# Patient Record
Sex: Male | Born: 1955 | Race: Black or African American | Hispanic: No | Smoking: Former smoker
Health system: Southern US, Community
[De-identification: ages and names within clinical notes are randomized; demographics above are authoritative.]

## PROBLEM LIST (undated history)

## (undated) DIAGNOSIS — E049 Nontoxic goiter, unspecified: Secondary | ICD-10-CM

## (undated) DIAGNOSIS — R1310 Dysphagia, unspecified: Secondary | ICD-10-CM

## (undated) DIAGNOSIS — E785 Hyperlipidemia, unspecified: Secondary | ICD-10-CM

## (undated) HISTORY — DX: Dysphagia, unspecified: R13.10

## (undated) HISTORY — DX: Hyperlipidemia, unspecified: E78.5

## (undated) HISTORY — PX: EYE SURGERY: SHX253

## (undated) HISTORY — DX: Nontoxic goiter, unspecified: E04.9

---

## 2016-01-16 ENCOUNTER — Other Ambulatory Visit: Payer: Self-pay | Admitting: Internal Medicine

## 2016-01-16 DIAGNOSIS — R1319 Other dysphagia: Secondary | ICD-10-CM

## 2016-01-17 ENCOUNTER — Ambulatory Visit
Admission: RE | Admit: 2016-01-17 | Discharge: 2016-01-17 | Disposition: A | Discharge status: Home / Self Care | Payer: 59 | Source: Ambulatory Visit | Attending: Internal Medicine | Admitting: Internal Medicine

## 2016-01-17 ENCOUNTER — Other Ambulatory Visit: Payer: Self-pay | Admitting: Internal Medicine

## 2016-01-17 DIAGNOSIS — M2570 Osteophyte, unspecified joint: Secondary | ICD-10-CM | POA: Diagnosis not present

## 2016-01-17 DIAGNOSIS — R1319 Other dysphagia: Secondary | ICD-10-CM | POA: Diagnosis present

## 2016-01-17 DIAGNOSIS — M542 Cervicalgia: Secondary | ICD-10-CM

## 2016-01-17 DIAGNOSIS — R1310 Dysphagia, unspecified: Secondary | ICD-10-CM

## 2016-01-22 ENCOUNTER — Other Ambulatory Visit: Payer: Self-pay | Admitting: Internal Medicine

## 2016-01-22 ENCOUNTER — Ambulatory Visit
Admission: RE | Admit: 2016-01-22 | Discharge: 2016-01-22 | Disposition: A | Discharge status: Home / Self Care | Payer: 59 | Source: Ambulatory Visit | Attending: Internal Medicine | Admitting: Internal Medicine

## 2016-01-22 DIAGNOSIS — M542 Cervicalgia: Secondary | ICD-10-CM

## 2016-01-22 MED ORDER — IOPAMIDOL (ISOVUE-300) INJECTION 61%
75.0000 mL | Freq: Once | INTRAVENOUS | Status: AC | PRN
  Administered 2016-01-22: 75 mL via INTRAVENOUS

## 2016-01-23 ENCOUNTER — Ambulatory Visit: Payer: Self-pay

## 2016-07-14 ENCOUNTER — Encounter: Payer: Self-pay | Admitting: General Surgery

## 2016-07-31 ENCOUNTER — Encounter: Payer: Self-pay | Admitting: General Surgery

## 2016-07-31 ENCOUNTER — Encounter: Payer: Self-pay | Admitting: *Deleted

## 2016-07-31 ENCOUNTER — Ambulatory Visit (INDEPENDENT_AMBULATORY_CARE_PROVIDER_SITE_OTHER): Payer: 59 | Admitting: General Surgery

## 2016-07-31 VITALS — BP 122/74 | HR 82 | Resp 14 | Ht 72.0 in | Wt 235.0 lb

## 2016-07-31 DIAGNOSIS — M542 Cervicalgia: Secondary | ICD-10-CM | POA: Diagnosis not present

## 2016-07-31 NOTE — Patient Instructions (Signed)
Recommend orthopedic or neurosurgeon  The patient is aware to call back for any questions or concerns.

## 2016-07-31 NOTE — Progress Notes (Signed)
Patient has been scheduled for an appointment with Altamese CabalMaurice Jones, PA at [facility] for 07-31-16 at 2:30 pm.

## 2016-07-31 NOTE — Progress Notes (Addendum)
Patient ID: James Dunlap, male   DOB: [birth year], 61 y.o.   MRN: [id number]  Chief Complaint  Patient presents with  . Lipoma    HPI James Dunlap is a 61 y.o. male. Here today for evaluation of a possible lipoma on his shoulder close to his neck area. He states it has been there for 3 weeks and started with a sore throat. He is having some discomfort with the swelling with sharp pain. The pain is at the base of his left head.He has been to the ED twice at [facility] and x rays were done there. He states the medications are not helping.  He describes focal pain behind left ear.   CT scan was done 01-22-16 for left neck swelling. I have reviewed the history of present illness with the patient.     HPI  Past Medical History:  Diagnosis Date  . Dysphagia   . Goiter   . Hyperlipidemia     Past Surgical History:  Procedure Laterality Date  . EYE SURGERY      History reviewed. No pertinent family history.  Social History Social History  Substance Use Topics  . Smoking status: Former Smoker    Quit date: 05/06/1987  . Smokeless tobacco: Never Used  . Alcohol use Yes     Comment: occasionally    No Known Allergies  Current Outpatient Prescriptions  Medication Sig Dispense Refill  . celecoxib (CELEBREX) 100 MG capsule Take 100 mg by mouth 2 (two) times daily.    . diazepam (VALIUM) 5 MG tablet Take 5 mg by mouth every 8 (eight) hours as needed for anxiety.    Marland Kitchen. levothyroxine (SYNTHROID, LEVOTHROID) 100 MCG tablet Take 100 mcg by mouth daily before breakfast.    . methocarbamol (ROBAXIN) 750 MG tablet Take 750 mg by mouth 4 (four) times daily.     No current facility-administered medications for this visit.     Review of Systems Review of Systems  Constitutional: Negative.   Respiratory: Negative.   Cardiovascular: Negative.     Blood pressure 122/74, pulse 82, resp. rate 14, height 6' (1.829 m), weight 235 lb (106.6 kg).  Physical Exam Physical Exam   Constitutional: He is oriented to person, place, and time. He appears well-developed and well-nourished.  HENT:  Mouth/Throat: Oropharynx is clear and moist.  Eyes: Conjunctivae are normal. No scleral icterus.  Neck: Neck supple.    Subtle fullness in left supraclavicular region, flattens out when sits upright. Feels like prominent subcutaneous tissue. Pt with notable stiffness of neck, unable to move it much at all due to pain  Lymphadenopathy:    He has no cervical adenopathy.  Neurological: He is alert and oriented to person, place, and time.  Skin: Skin is warm and dry.  Psychiatric: His behavior is normal.    Data Reviewed Prior notes, CT scan and chest x ray No findings other than known thyroid nodule Assessment    Severe neck pain- no apparent cause. Fullness in left supraclavicular region  Not suggestive of a defined lipoma-can be followed.      Plan    Recommend orthopedic or neurosurgeon referral for further evaluation. Pt agreeable.     This information has been scribed by Dorathy DaftMarsha Hatch RN, BSN,BC.   James Dunlap 08/06/2016, 1:01 PM

## 2016-10-01 ENCOUNTER — Other Ambulatory Visit: Payer: Self-pay | Admitting: Neurology

## 2016-10-01 DIAGNOSIS — M542 Cervicalgia: Secondary | ICD-10-CM

## 2016-10-09 ENCOUNTER — Ambulatory Visit: Payer: 59

## 2016-10-21 ENCOUNTER — Ambulatory Visit
Admission: RE | Admit: 2016-10-21 | Discharge: 2016-10-21 | Disposition: A | Discharge status: Home / Self Care | Payer: 59 | Source: Ambulatory Visit | Attending: Neurology | Admitting: Neurology

## 2016-10-21 DIAGNOSIS — M50221 Other cervical disc displacement at C4-C5 level: Secondary | ICD-10-CM | POA: Insufficient documentation

## 2016-10-21 DIAGNOSIS — M542 Cervicalgia: Secondary | ICD-10-CM | POA: Diagnosis present

## 2016-10-21 DIAGNOSIS — M50222 Other cervical disc displacement at C5-C6 level: Secondary | ICD-10-CM | POA: Insufficient documentation

## 2016-10-21 DIAGNOSIS — M4802 Spinal stenosis, cervical region: Secondary | ICD-10-CM | POA: Insufficient documentation

## 2017-09-19 IMAGING — RF DG ESOPHAGUS
5 series · 5 of 5 positions shown · non-contrast
Comparison: None in PACs

CLINICAL DATA: Two months of throat discomfort generally with
solids with palpable swelling in the neck on the left with some
discomfort in the left shoulder. No history of gastroesophageal
reflux. Patient reports no significant difficulty with liquids.

EXAM:
ESOPHOGRAM / BARIUM SWALLOW / BARIUM TABLET STUDY
TECHNIQUE: Combined double contrast and single contrast examination performed
using effervescent crystals, thick barium liquid, and thin barium
liquid. The patient was observed with fluoroscopy swallowing a 13 mm
barium sulphate tablet.
FLUOROSCOPY TIME:  Fluoroscopy Time:  1 minutes, 6 seconds
Radiation Exposure Index (if provided by the fluoroscopic device):
1246.86 uNymF
Number of Acquired Spot Images: 5 +1 video loop

[Series 1: fluoro_barium 2fps_bw · 0.17mm/px · 1 of 1 slices shown (1 of 5)]
[im 1/1]
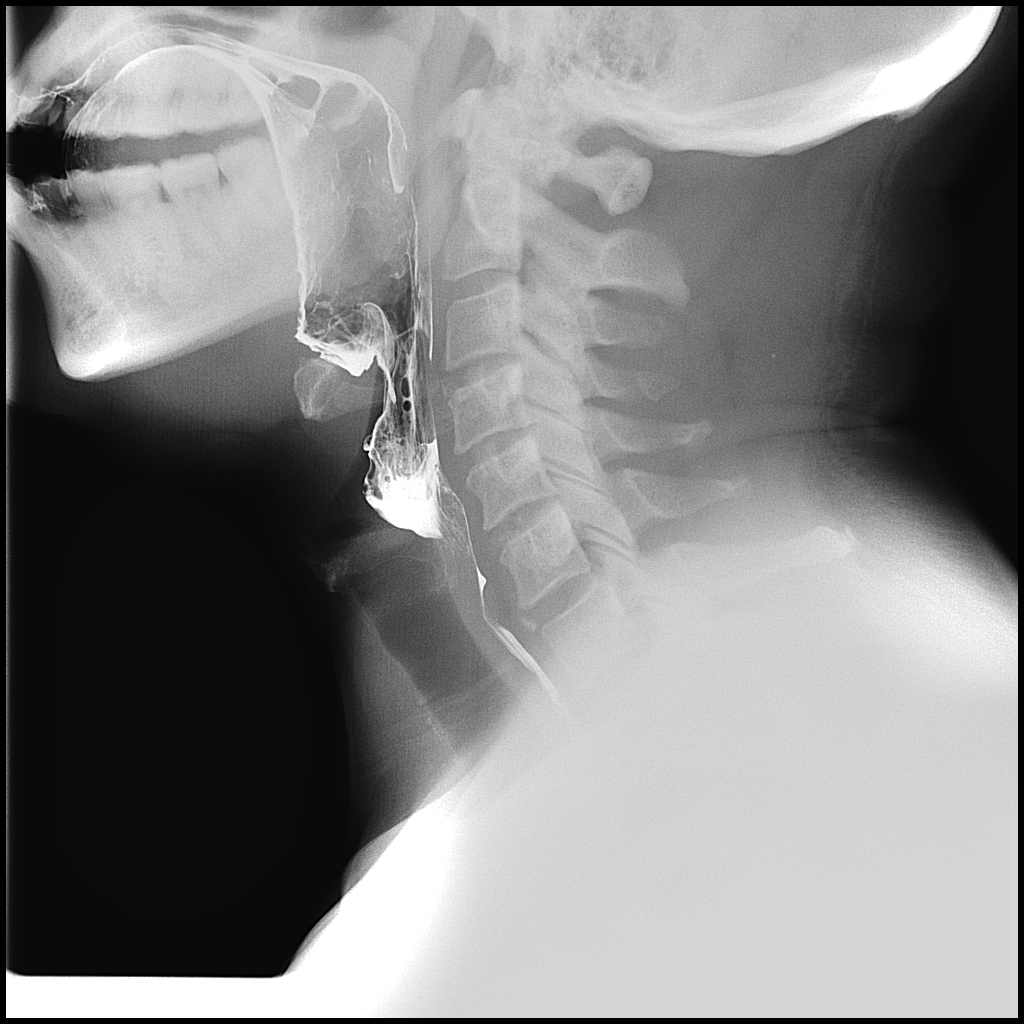

[Series 2: fluoro_barium 2fps_bw · 0.18mm/px · 1 of 1 slices shown (2 of 5)]
[im 1/1]
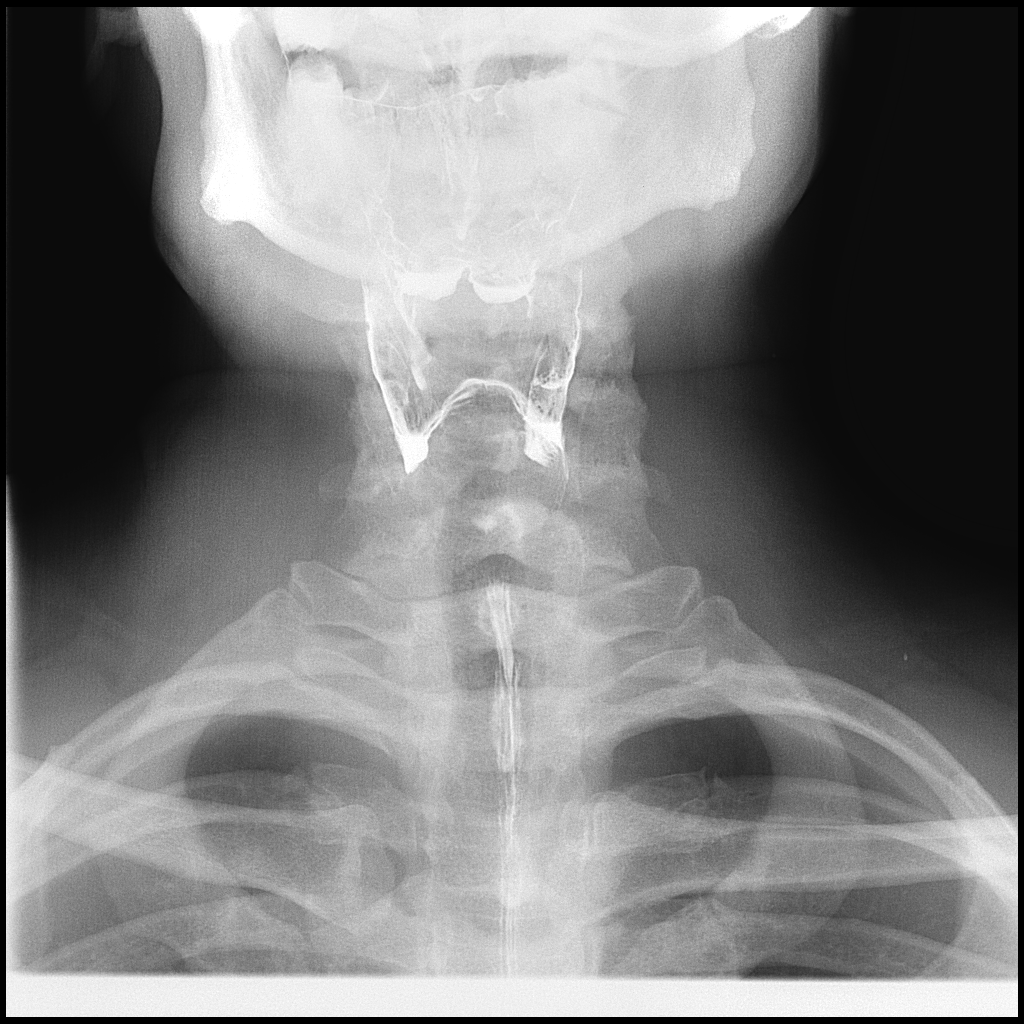

[Series 3: fluoro_barium 2fps_bw · 0.18mm/px · 1 of 1 slices shown (3 of 5)]
[im 1/1]
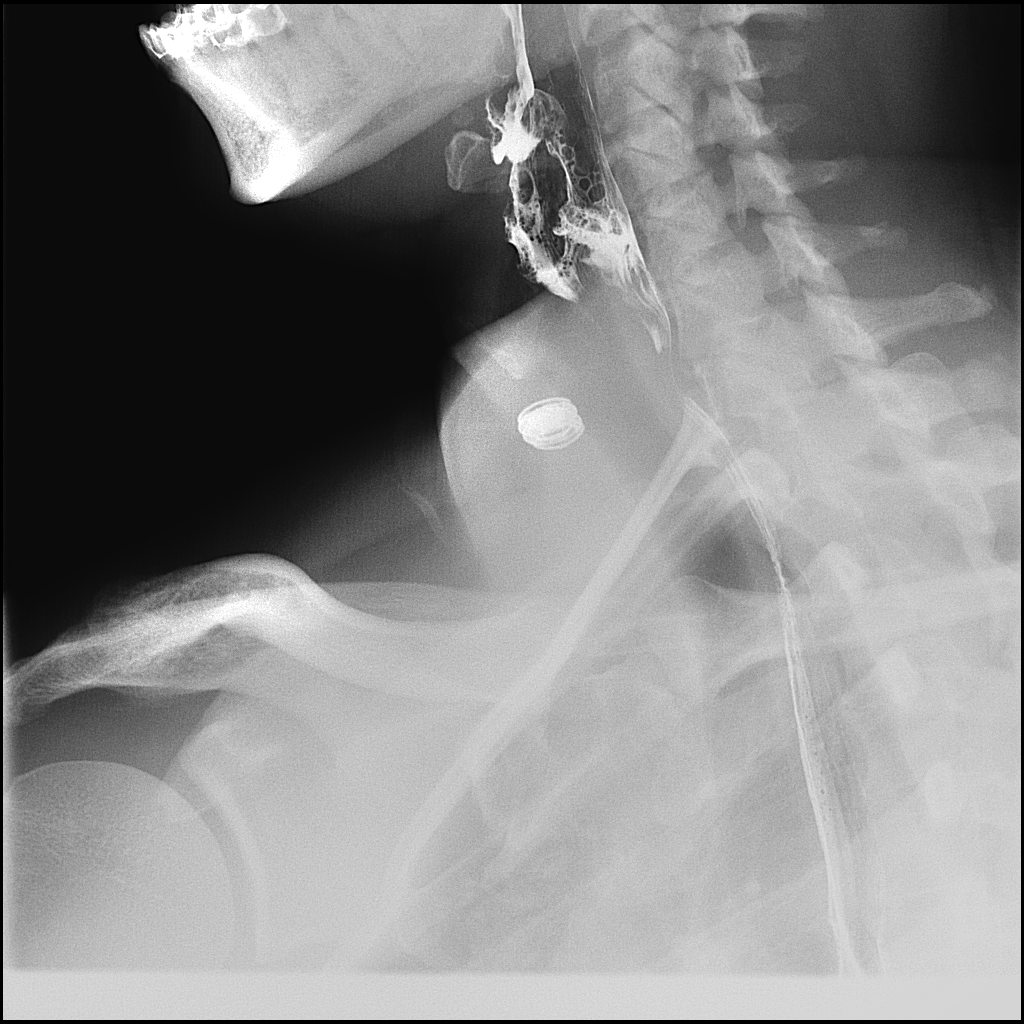

[Series 4: fluoro_barium 2fps_bw · 0.17mm/px · 1 of 1 slices shown (4 of 5)]
[im 1/1]
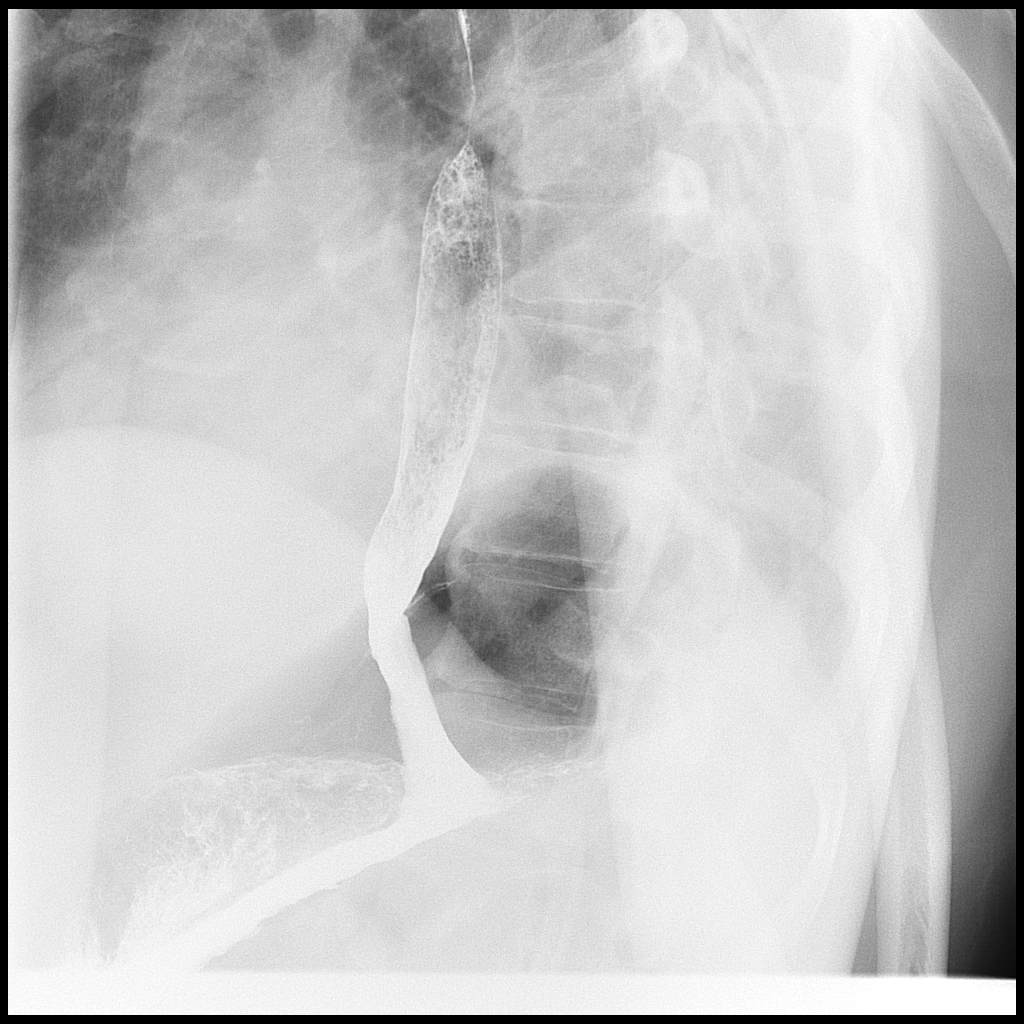

[Series 5: fluoro_barium 2fps_bw · 0.18mm/px · 1 of 1 slices shown (5 of 5)]
[im 1/1]
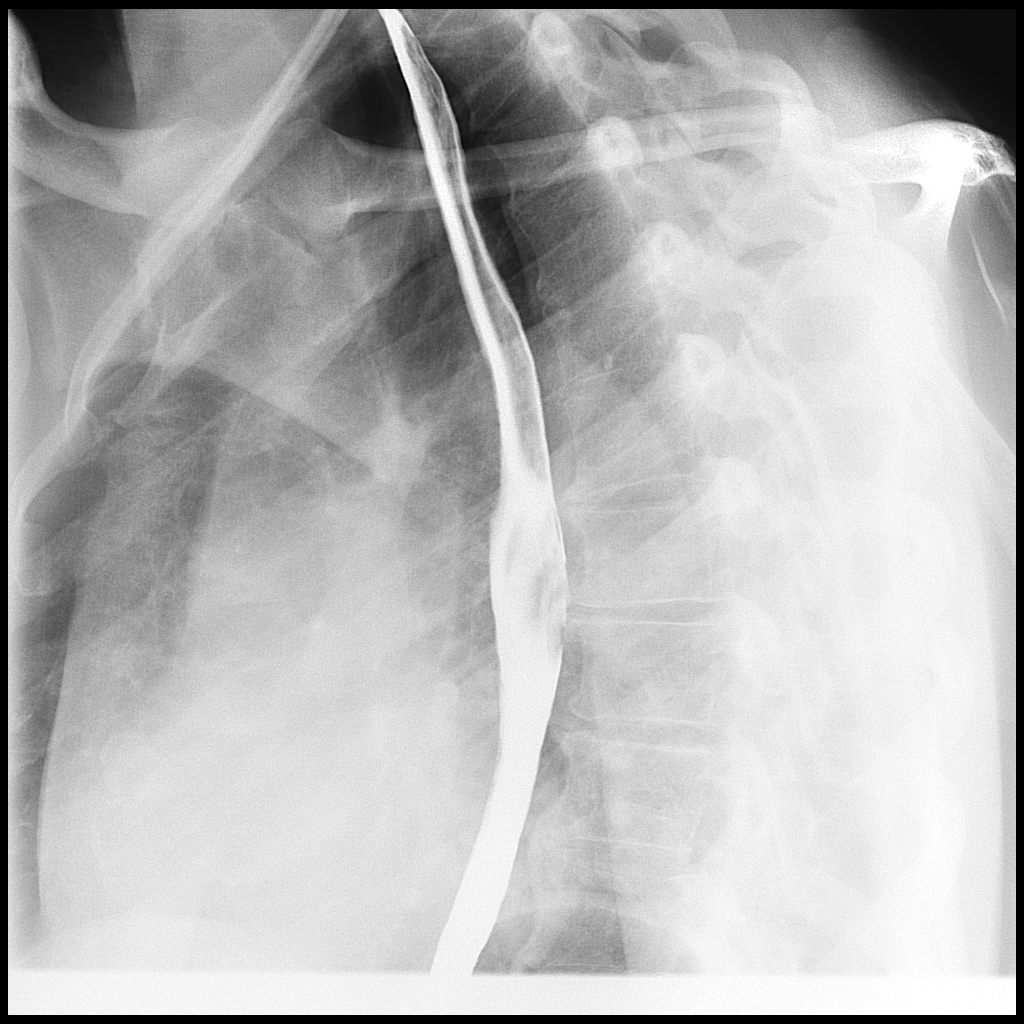

[5 of 5 positions shown; findings below may reference images not displayed]

FINDINGS: The cervical esophagus distended well. There was no laryngeal
penetration of the barium. A mild impression is made upon the
posterior aspect of the cervical spit esophagus by an osteophyte at
C6-7. The thoracic esophagus distended well. The mucosal pattern was
normal. There was no evidence of stricture nor esophagitis. There
was no hiatal hernia. No gastroesophageal reflux was observed. The
barium tablet passed promptly from the mouth to the stomach.
IMPRESSION: Normal barium swallow examination. No significant mass-effect upon
normal esophageal structures is noted. Minimal impression due to its
C6-7 osteophyte is noted on the posterior aspect of the cervical
esophagus.

Given the patient's symptoms, direct examination of the hypopharynx
as well with CT scanning of the neck may be useful. The patient
indicated point tenderness on the left just lateral to the lower
sternocleidomastoid muscle.

## 2017-09-19 IMAGING — RF DG ESOPHAGUS
1 series · 4 of 4 positions shown · non-contrast
Comparison: None in PACs

CLINICAL DATA: Two months of throat discomfort generally with
solids with palpable swelling in the neck on the left with some
discomfort in the left shoulder. No history of gastroesophageal
reflux. Patient reports no significant difficulty with liquids.

EXAM:
ESOPHOGRAM / BARIUM SWALLOW / BARIUM TABLET STUDY
TECHNIQUE: Combined double contrast and single contrast examination performed
using effervescent crystals, thick barium liquid, and thin barium
liquid. The patient was observed with fluoroscopy swallowing a 13 mm
barium sulphate tablet.
FLUOROSCOPY TIME:  Fluoroscopy Time:  1 minutes, 6 seconds
Radiation Exposure Index (if provided by the fluoroscopic device):
1246.86 uNymF
Number of Acquired Spot Images: 5 +1 video loop

[Series 6: fluoro_barium 2fps_bw · 0.18mm/px · 4 of 16 frames shown]
[frame 3/16]
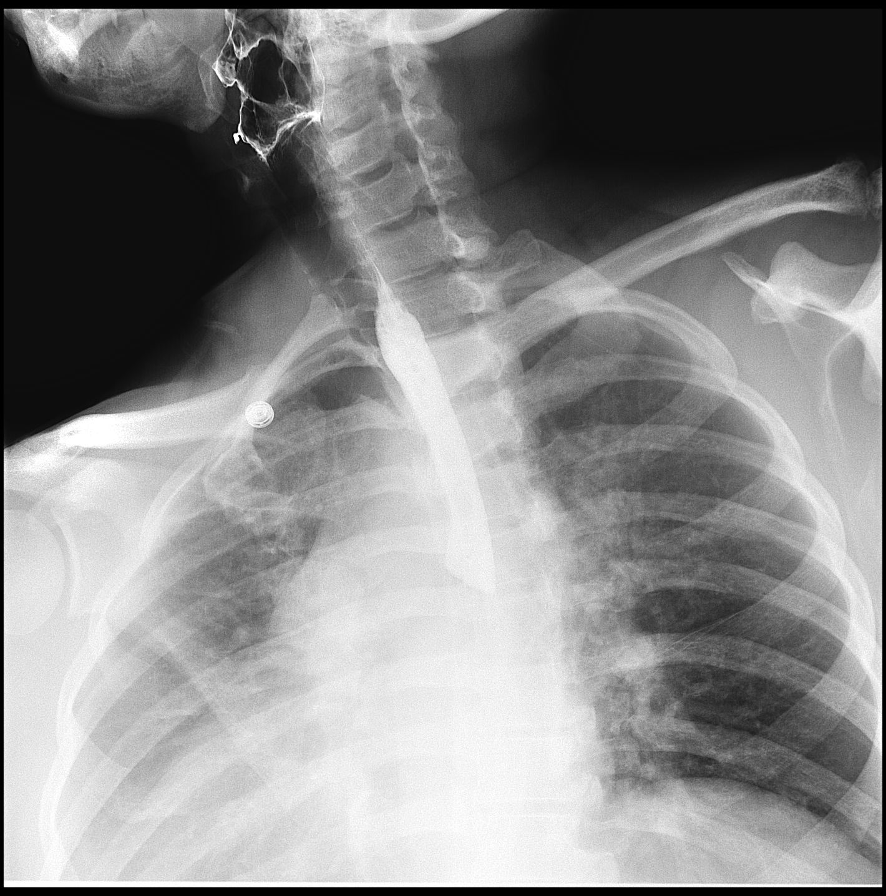
[frame 8/16]
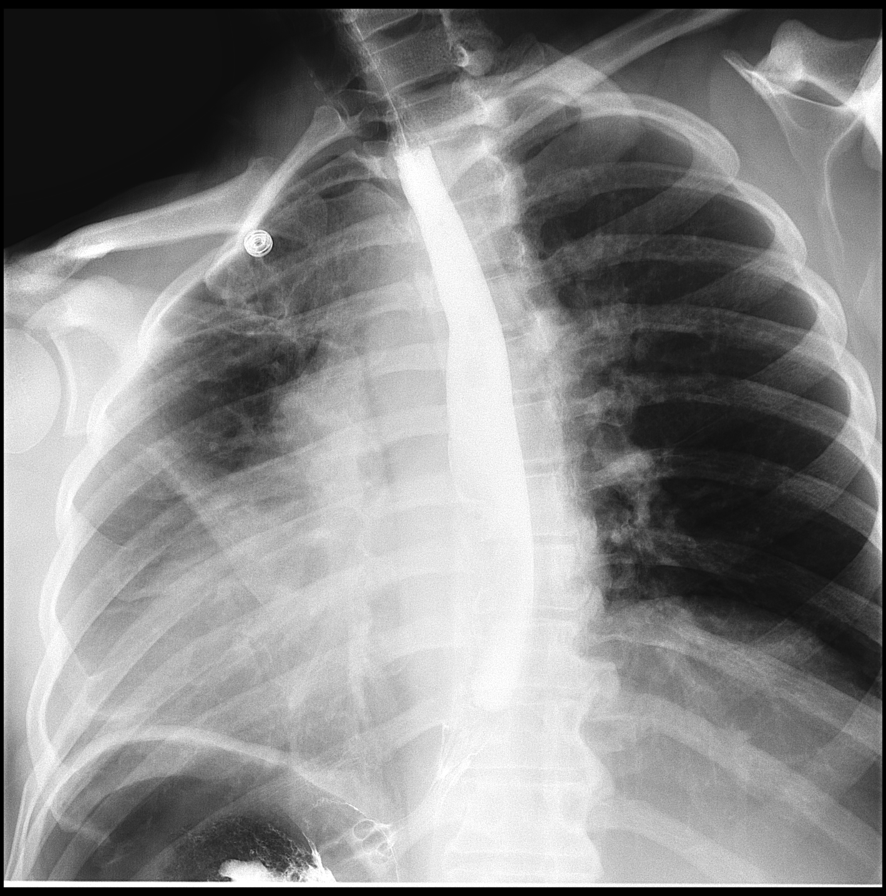
[frame 9/16]
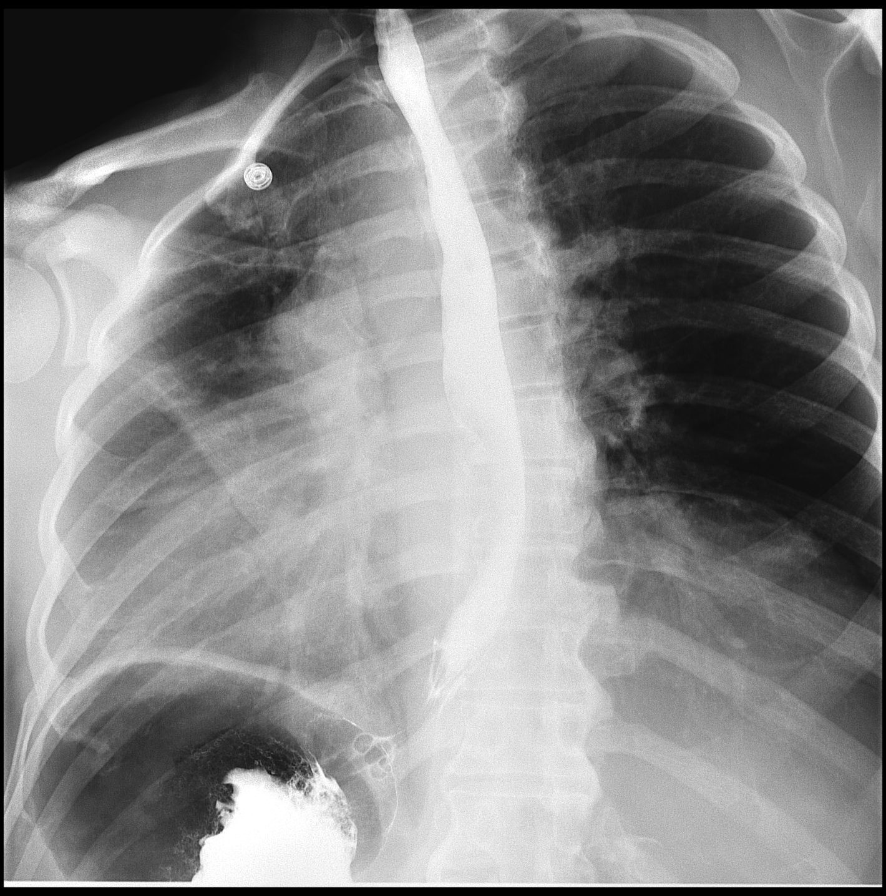
[frame 14/16]
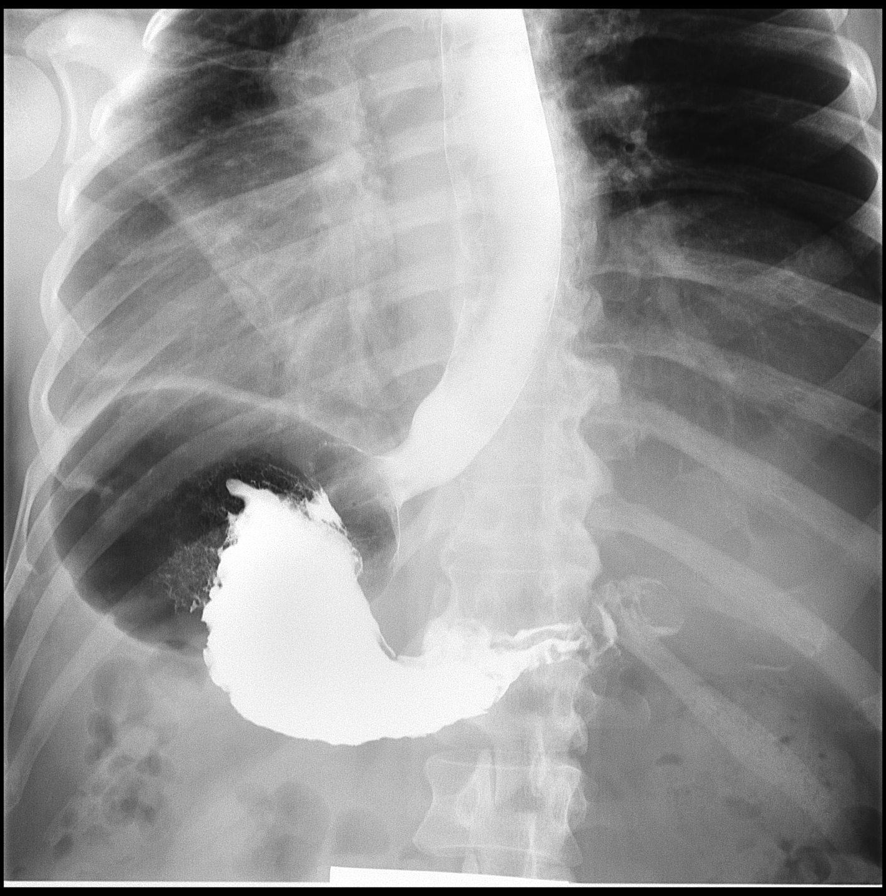

[4 of 4 positions shown; findings below may reference images not displayed]

FINDINGS: The cervical esophagus distended well. There was no laryngeal
penetration of the barium. A mild impression is made upon the
posterior aspect of the cervical spit esophagus by an osteophyte at
C6-7. The thoracic esophagus distended well. The mucosal pattern was
normal. There was no evidence of stricture nor esophagitis. There
was no hiatal hernia. No gastroesophageal reflux was observed. The
barium tablet passed promptly from the mouth to the stomach.
IMPRESSION: Normal barium swallow examination. No significant mass-effect upon
normal esophageal structures is noted. Minimal impression due to its
C6-7 osteophyte is noted on the posterior aspect of the cervical
esophagus.

Given the patient's symptoms, direct examination of the hypopharynx
as well with CT scanning of the neck may be useful. The patient
indicated point tenderness on the left just lateral to the lower
sternocleidomastoid muscle.

## 2017-09-19 IMAGING — CR DG CHEST 2V
1 series · 1 of 1 positions shown · non-contrast
Comparison: None.

CLINICAL DATA: Sore throat, swelling are left-sided neck, painful
to swallow for 2 months.

EXAM:
CHEST  2 VIEW

[w chest pa]
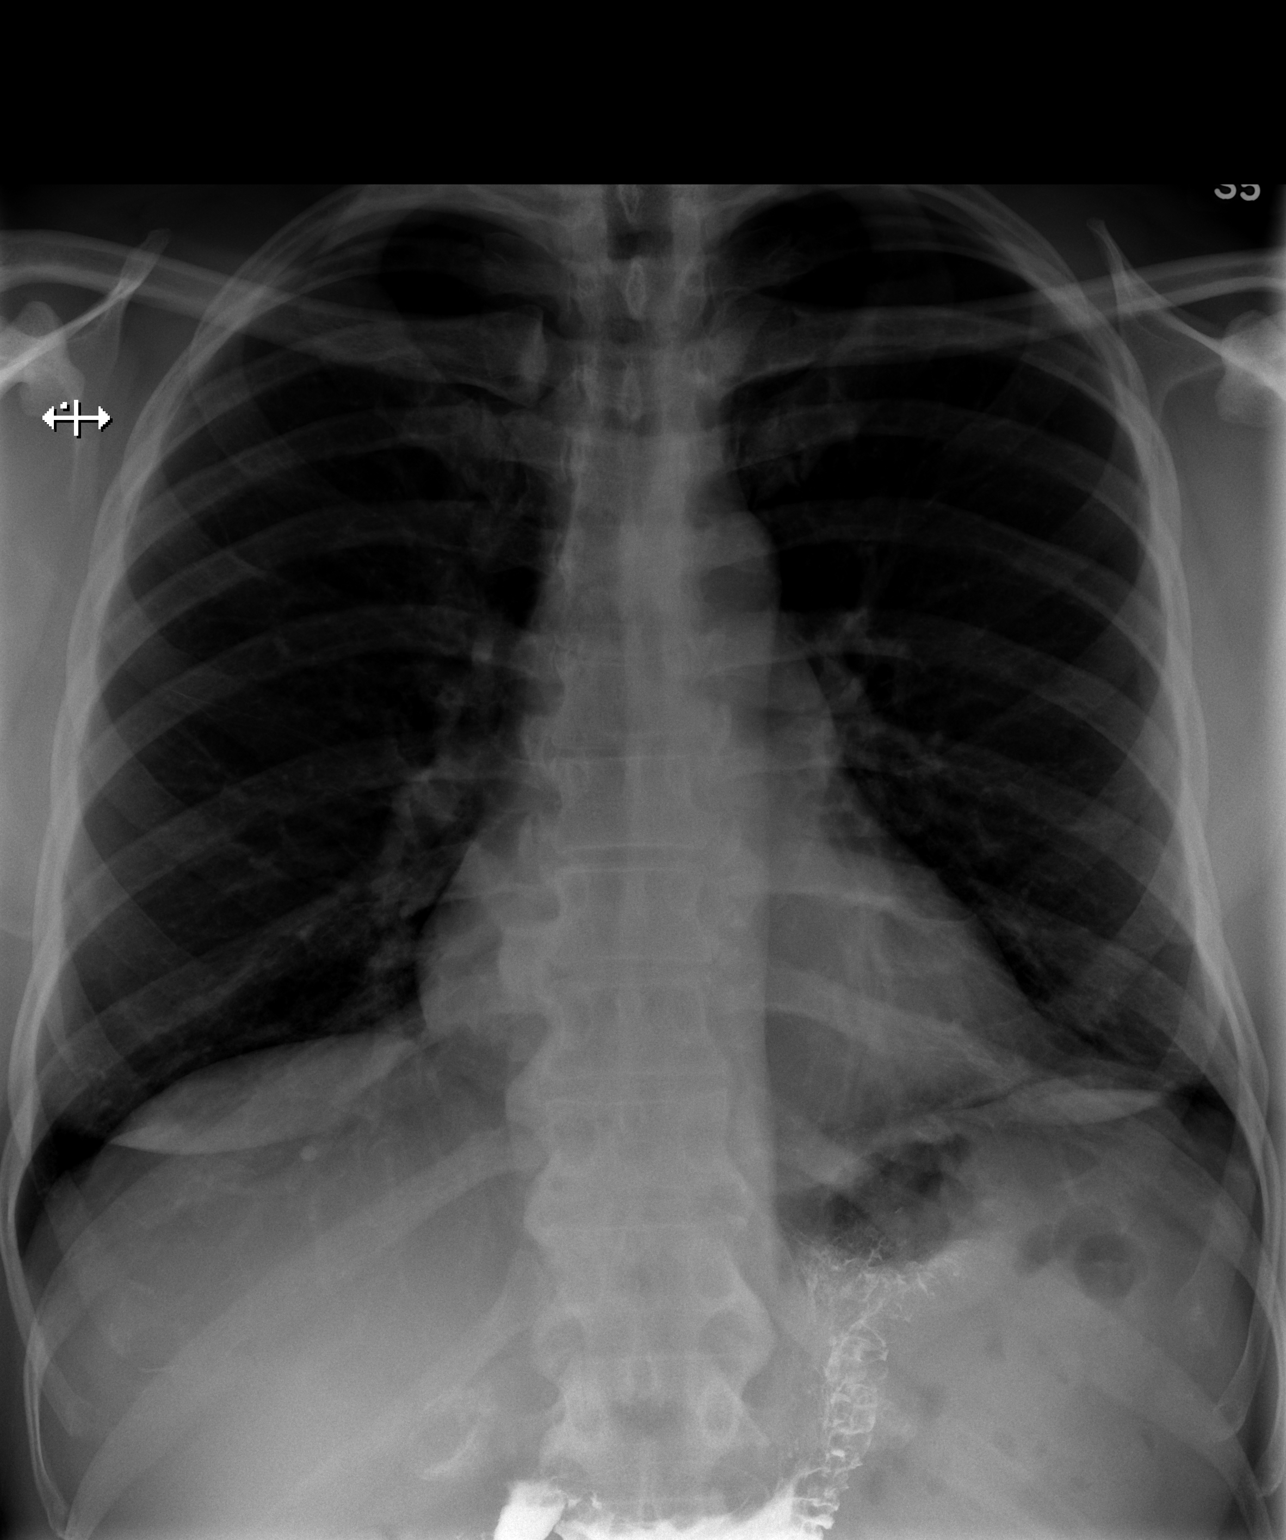

[1 of 1 positions shown; findings below may reference images not displayed]

FINDINGS: Heart size is normal. Overall cardiomediastinal silhouette is within
normal limits in size and configuration.

Lungs are difficult to characterize due to over penetration,
particularly the upper lungs, but no obvious evidence of pneumonia
or pleural effusion. Osseous structures about the chest are
unremarkable.
IMPRESSION: 1. Lungs are difficult to characterize due to technique
(overpenetration). Consider repeating exam.
2. Lungs are grossly clear. No evidence of pneumonia or pleural
effusion.
3. Heart size is normal.

## 2022-03-05 ENCOUNTER — Encounter: Payer: Self-pay | Admitting: Internal Medicine

## 2022-04-07 ENCOUNTER — Ambulatory Visit: Payer: Managed Care, Other (non HMO) | Admitting: Gastroenterology
